# Patient Record
Sex: Male | Born: 1995 | Race: White | Hispanic: No | Marital: Single | Smoking: Current some day smoker
Health system: Southern US, Community
[De-identification: ages and names within clinical notes are randomized; demographics above are authoritative.]

## PROBLEM LIST (undated history)

## (undated) DIAGNOSIS — J45909 Unspecified asthma, uncomplicated: Secondary | ICD-10-CM

---
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
## 2016-11-18 IMAGING — CT CT ABD-PELV W/ CM
1 of 2 series · 15 of 32 positions shown, 19 images · IV contrast (omnipaque)
Comparison: None.

CLINICAL DATA: Abdominal pain

EXAM:
CT ABDOMEN AND PELVIS WITH CONTRAST
TECHNIQUE: Multidetector CT imaging of the abdomen and pelvis was performed
using the standard protocol following bolus administration of
intravenous contrast.
CONTRAST:  80mL OMNIPAQUE IOHEXOL 350 MG/ML SOLN

[Series 2: routine abd pel with · axial · 0.68mm/px · z∈[-1102,-666]mm · 15 of 95 slices shown, 19 images]
[im 4/95  soft-tissue]
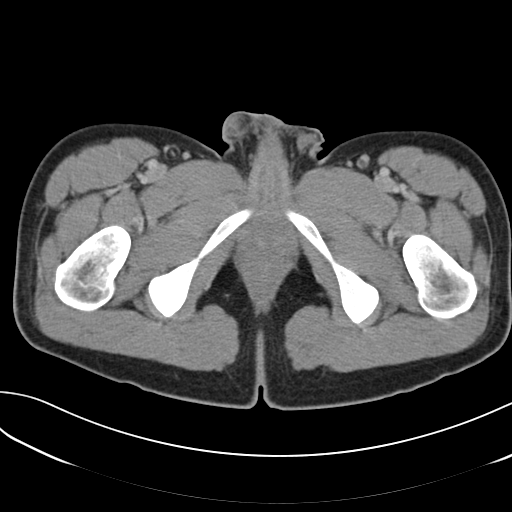
[im 4/95  bone]
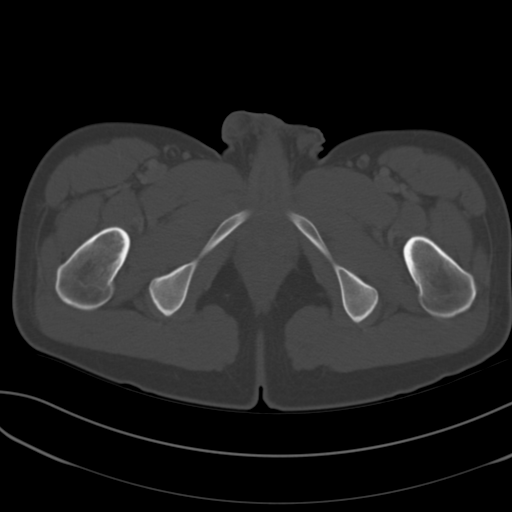
[im 12/95  soft-tissue]
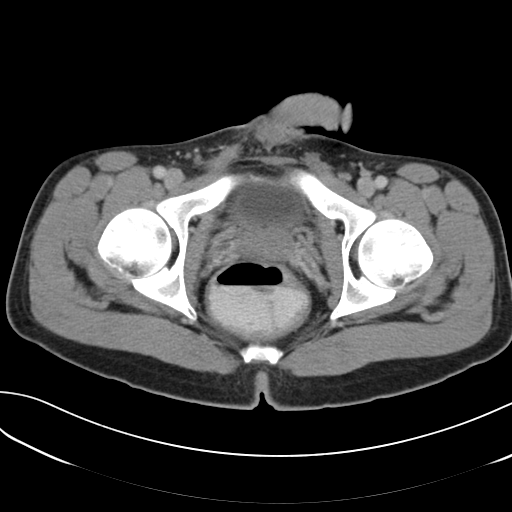
[im 20/95  soft-tissue]
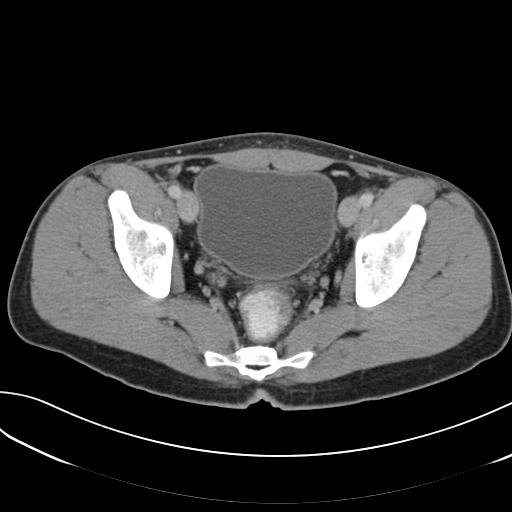
[im 28/95  soft-tissue]
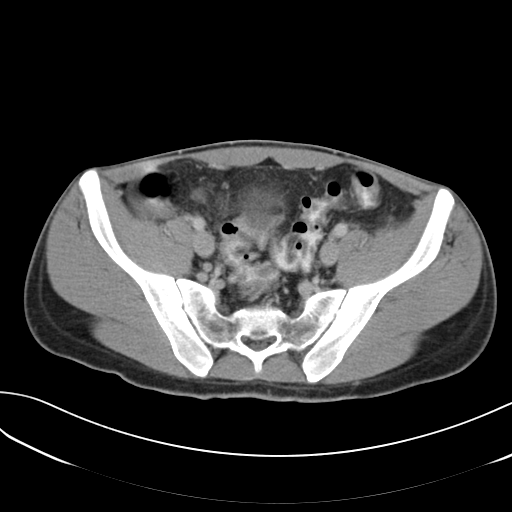
[im 32/95  soft-tissue]
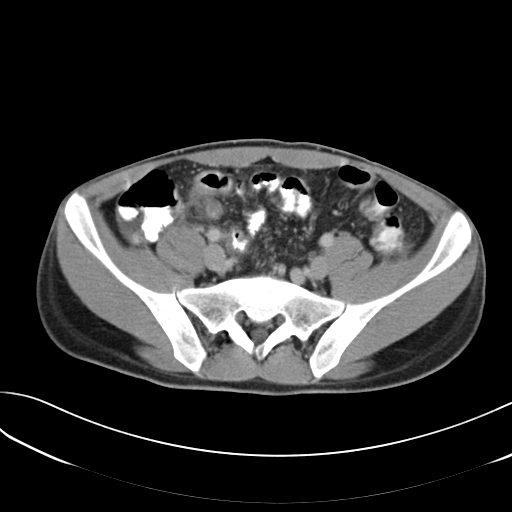
[im 40/95  soft-tissue]
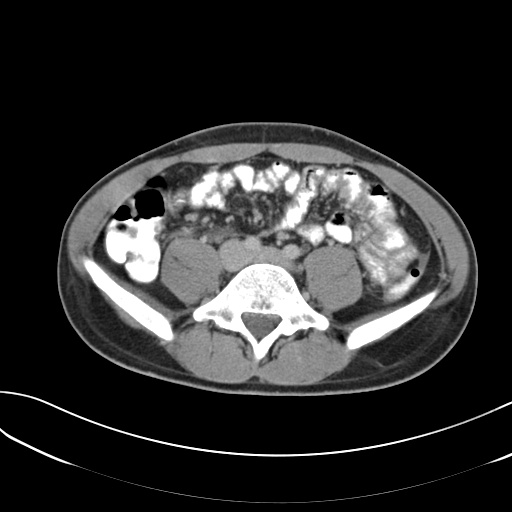
[im 48/95  soft-tissue]
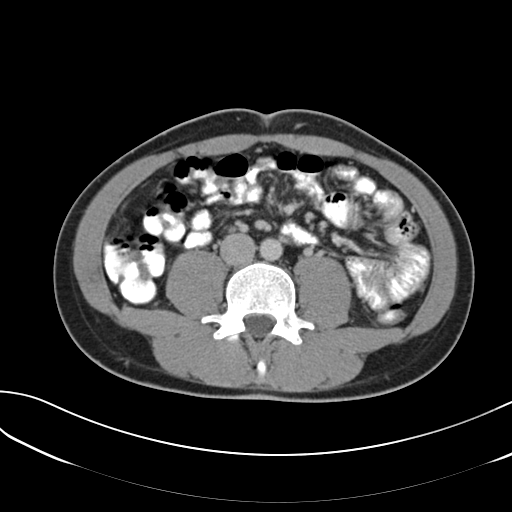
[im 55/95  soft-tissue]
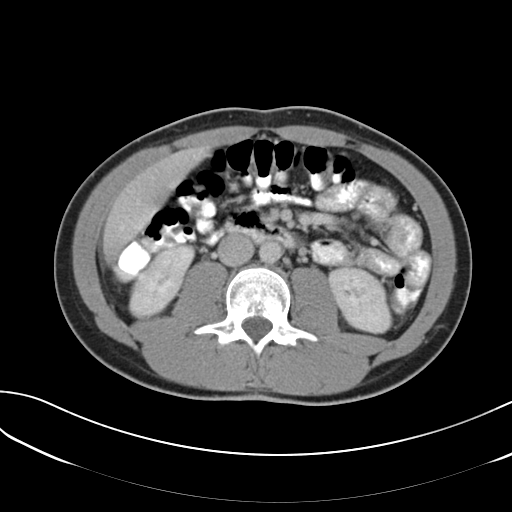
[im 63/95  soft-tissue]
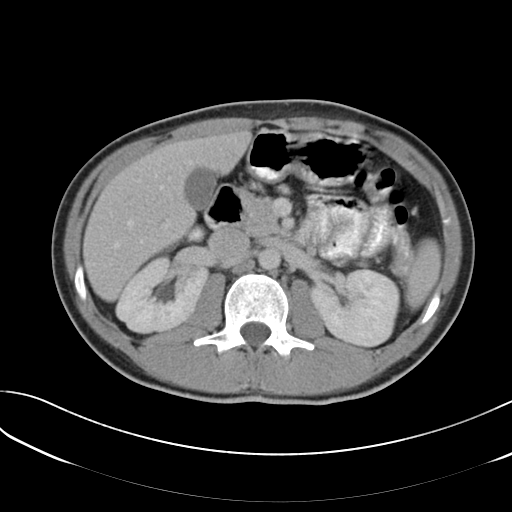
[im 63/95  bone]
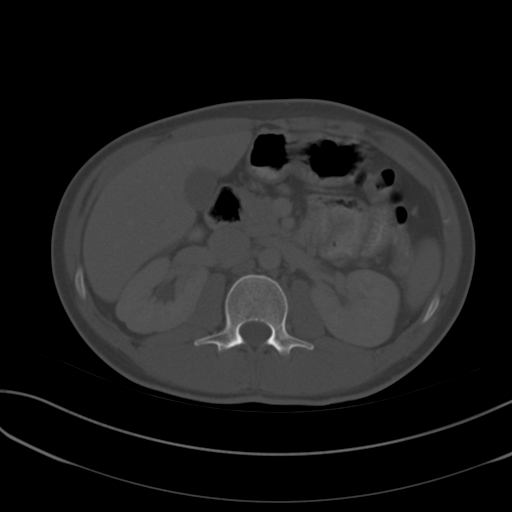
[im 67/95  soft-tissue]
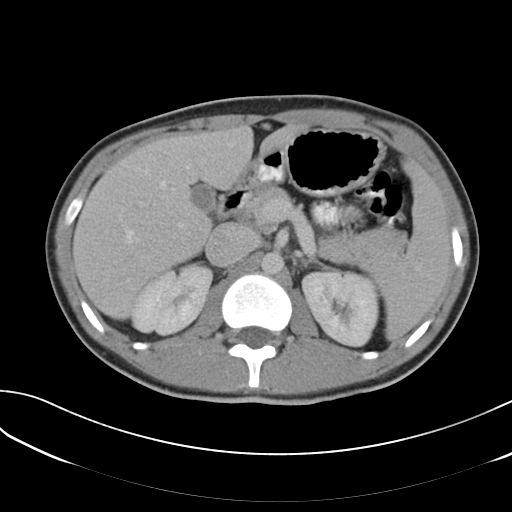
[im 75/95  soft-tissue]
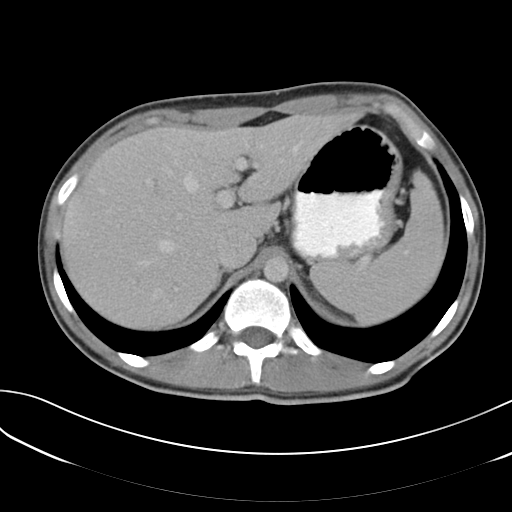
[im 79/95  lung]
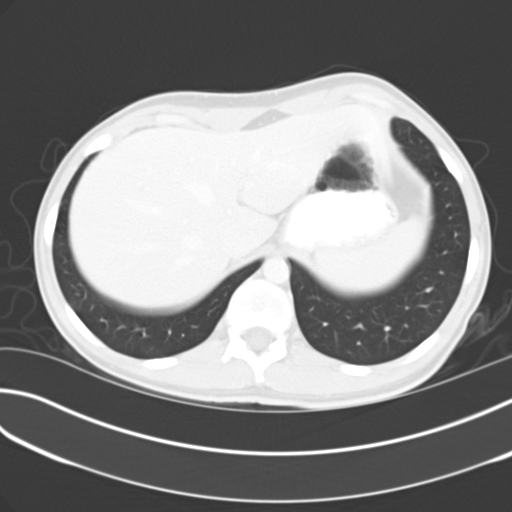
[im 83/95  soft-tissue]
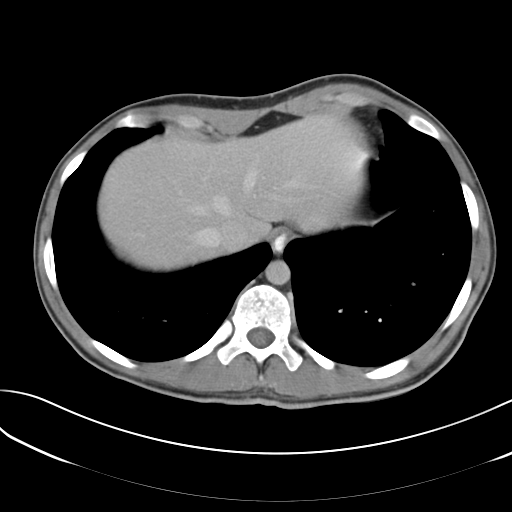
[im 83/95  lung]
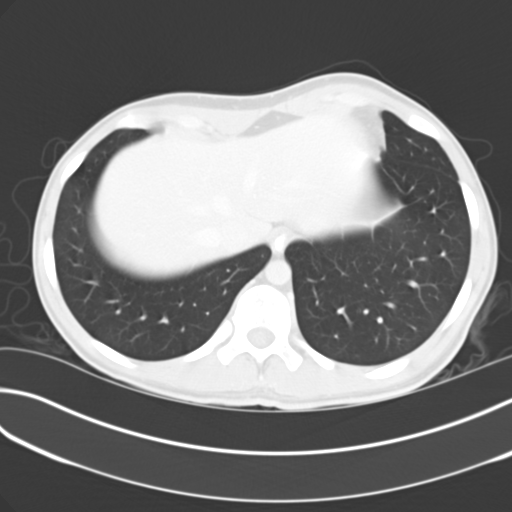
[im 87/95  lung]
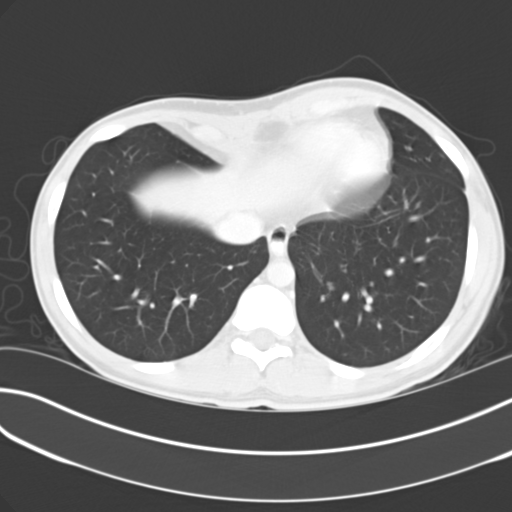
[im 91/95  soft-tissue]
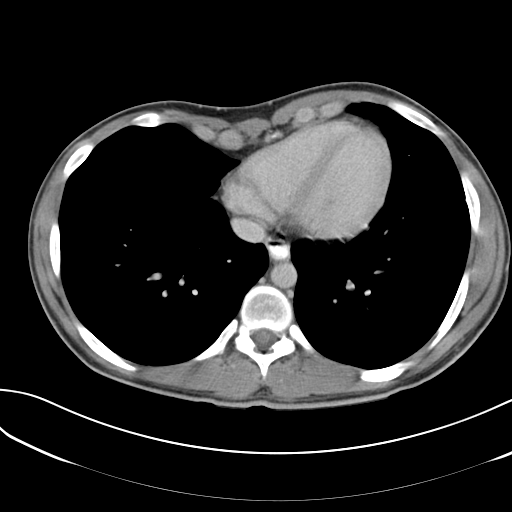
[im 91/95  lung]
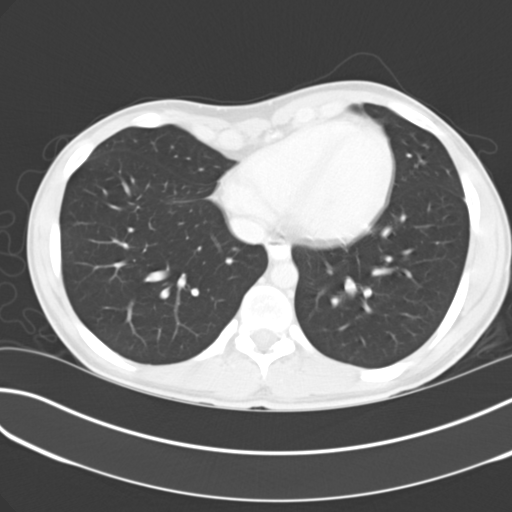

[15 of 32 positions shown; findings below may reference images not displayed]

FINDINGS: Lower chest:  No acute findings.

Hepatobiliary: No masses or other significant abnormality.

Pancreas: No mass, inflammatory changes, or other significant
abnormality.

Spleen: Within normal limits in size and appearance.

Adrenals/Urinary Tract: No masses identified. No evidence of
hydronephrosis.

Stomach/Bowel: The stomach is within normal limits. The small bowel
loops have a normal course and caliber. No obstruction. Normal
appearance of the colon. The appendix is visualized in the right
lower quadrant, and appears thickened and inflamed measuring 12 mm
in diameter and containing multiple small at appendicoliths. No free
intraperitoneal air. No abscess. A small amount of free fluid is
noted within the right lower quadrant. Secondary inflammation of the
terminal ileum is noted with mild wall thickening.

Vascular/Lymphatic: No pathologically enlarged lymph nodes. No
evidence of abdominal aortic aneurysm.

Reproductive: No mass or other significant abnormality.

Other: No abscess identified.

Musculoskeletal:  No suspicious bone lesions identified.
IMPRESSION: 1. Findings compatible with acute appendicitis.
2. These results will be called to the ordering clinician or
representative by the Radiologist Assistant, and communication
documented in the PACS or zVision Dashboard.

## 2024-01-07 ENCOUNTER — Ambulatory Visit (HOSPITAL_BASED_OUTPATIENT_CLINIC_OR_DEPARTMENT_OTHER): Payer: Managed Care, Other (non HMO) | Admitting: Family Medicine
